# Patient Record
Sex: Female | Born: 1950 | Hispanic: No | Marital: Married | Smoking: Never smoker
Health system: Southern US, Community
[De-identification: ages and names within clinical notes are randomized; demographics above are authoritative.]

## PROBLEM LIST (undated history)

## (undated) DIAGNOSIS — Z9889 Other specified postprocedural states: Secondary | ICD-10-CM

## (undated) DIAGNOSIS — R112 Nausea with vomiting, unspecified: Secondary | ICD-10-CM

## (undated) DIAGNOSIS — K219 Gastro-esophageal reflux disease without esophagitis: Secondary | ICD-10-CM

## (undated) DIAGNOSIS — H269 Unspecified cataract: Secondary | ICD-10-CM

## (undated) DIAGNOSIS — M1990 Unspecified osteoarthritis, unspecified site: Secondary | ICD-10-CM

## (undated) DIAGNOSIS — R202 Paresthesia of skin: Secondary | ICD-10-CM

## (undated) DIAGNOSIS — F419 Anxiety disorder, unspecified: Secondary | ICD-10-CM

## (undated) DIAGNOSIS — E785 Hyperlipidemia, unspecified: Secondary | ICD-10-CM

## (undated) HISTORY — DX: Other specified postprocedural states: Z98.890

## (undated) HISTORY — DX: Unspecified cataract: H26.9

## (undated) HISTORY — PX: CATARACT EXTRACTION, BILATERAL: SHX1313

## (undated) HISTORY — DX: Nausea with vomiting, unspecified: R11.2

## (undated) HISTORY — PX: TONSILLECTOMY: SUR1361

## (undated) HISTORY — PX: ABDOMINAL HYSTERECTOMY: SHX81

## (undated) HISTORY — PX: OTHER SURGICAL HISTORY: SHX169

## (undated) HISTORY — DX: Hyperlipidemia, unspecified: E78.5

## (undated) HISTORY — PX: DILATION AND CURETTAGE OF UTERUS: SHX78

## (undated) HISTORY — PX: OVARIAN CYST REMOVAL: SHX89

## (undated) HISTORY — PX: APPENDECTOMY: SHX54

## (undated) HISTORY — PX: CHOLECYSTECTOMY: SHX55

---

## 1998-02-05 ENCOUNTER — Emergency Department (HOSPITAL_COMMUNITY): Admission: EM | Admit: 1998-02-05 | Discharge: 1998-02-05 | Payer: Self-pay | Admitting: Emergency Medicine

## 1998-08-02 ENCOUNTER — Other Ambulatory Visit: Admission: RE | Admit: 1998-08-02 | Discharge: 1998-08-02 | Payer: Self-pay | Admitting: Gynecology

## 1999-08-08 ENCOUNTER — Other Ambulatory Visit: Admission: RE | Admit: 1999-08-08 | Discharge: 1999-08-08 | Payer: Self-pay | Admitting: Gynecology

## 2000-07-30 ENCOUNTER — Other Ambulatory Visit: Admission: RE | Admit: 2000-07-30 | Discharge: 2000-07-30 | Payer: Self-pay | Admitting: Gynecology

## 2001-07-31 ENCOUNTER — Other Ambulatory Visit: Admission: RE | Admit: 2001-07-31 | Discharge: 2001-07-31 | Payer: Self-pay | Admitting: Gynecology

## 2002-05-20 ENCOUNTER — Other Ambulatory Visit: Admission: RE | Admit: 2002-05-20 | Discharge: 2002-05-20 | Payer: Self-pay | Admitting: Gynecology

## 2003-05-20 ENCOUNTER — Other Ambulatory Visit: Admission: RE | Admit: 2003-05-20 | Discharge: 2003-05-20 | Payer: Self-pay | Admitting: Gynecology

## 2004-06-20 ENCOUNTER — Other Ambulatory Visit: Admission: RE | Admit: 2004-06-20 | Discharge: 2004-06-20 | Payer: Self-pay | Admitting: Gynecology

## 2005-07-12 ENCOUNTER — Other Ambulatory Visit: Admission: RE | Admit: 2005-07-12 | Discharge: 2005-07-12 | Payer: Self-pay | Admitting: Gynecology

## 2006-08-15 ENCOUNTER — Other Ambulatory Visit: Admission: RE | Admit: 2006-08-15 | Discharge: 2006-08-15 | Payer: Self-pay | Admitting: Gynecology

## 2007-09-24 ENCOUNTER — Other Ambulatory Visit: Admission: RE | Admit: 2007-09-24 | Discharge: 2007-09-24 | Payer: Self-pay | Admitting: Gynecology

## 2008-08-19 ENCOUNTER — Other Ambulatory Visit: Admission: RE | Admit: 2008-08-19 | Discharge: 2008-08-19 | Payer: Self-pay | Admitting: Gynecology

## 2014-03-25 ENCOUNTER — Other Ambulatory Visit: Payer: Self-pay | Admitting: Internal Medicine

## 2014-03-25 DIAGNOSIS — S83289A Other tear of lateral meniscus, current injury, unspecified knee, initial encounter: Secondary | ICD-10-CM

## 2014-04-02 ENCOUNTER — Other Ambulatory Visit: Payer: Self-pay

## 2014-04-14 ENCOUNTER — Ambulatory Visit
Admission: RE | Admit: 2014-04-14 | Discharge: 2014-04-14 | Disposition: A | Discharge status: Home / Self Care | Payer: BC Managed Care – PPO | Source: Ambulatory Visit | Attending: Internal Medicine | Admitting: Internal Medicine

## 2014-04-14 DIAGNOSIS — S83289A Other tear of lateral meniscus, current injury, unspecified knee, initial encounter: Secondary | ICD-10-CM

## 2014-10-30 ENCOUNTER — Other Ambulatory Visit (HOSPITAL_COMMUNITY): Payer: Self-pay | Admitting: *Deleted

## 2014-11-02 ENCOUNTER — Ambulatory Visit (HOSPITAL_COMMUNITY)
Admission: RE | Admit: 2014-11-02 | Discharge: 2014-11-02 | Disposition: A | Discharge status: Home / Self Care | Payer: 59 | Source: Ambulatory Visit | Attending: Anesthesiology | Admitting: Anesthesiology

## 2014-11-02 ENCOUNTER — Encounter (HOSPITAL_COMMUNITY)
Admission: RE | Admit: 2014-11-02 | Discharge: 2014-11-02 | Disposition: A | Discharge status: Home / Self Care | Payer: 59 | Source: Ambulatory Visit | Attending: Orthopedic Surgery | Admitting: Orthopedic Surgery

## 2014-11-02 ENCOUNTER — Encounter (HOSPITAL_COMMUNITY): Payer: Self-pay

## 2014-11-02 DIAGNOSIS — R202 Paresthesia of skin: Secondary | ICD-10-CM

## 2014-11-02 DIAGNOSIS — R200 Anesthesia of skin: Secondary | ICD-10-CM | POA: Insufficient documentation

## 2014-11-02 HISTORY — DX: Paresthesia of skin: R20.2

## 2014-11-02 HISTORY — DX: Gastro-esophageal reflux disease without esophagitis: K21.9

## 2014-11-02 HISTORY — DX: Anxiety disorder, unspecified: F41.9

## 2014-11-02 LAB — URINALYSIS, ROUTINE W REFLEX MICROSCOPIC
Bilirubin Urine: NEGATIVE
Glucose, UA: NEGATIVE mg/dL
Hgb urine dipstick: NEGATIVE
Ketones, ur: NEGATIVE mg/dL
Leukocytes, UA: NEGATIVE
Nitrite: NEGATIVE
Protein, ur: NEGATIVE mg/dL
Specific Gravity, Urine: 1.012 (ref 1.005–1.030)
Urobilinogen, UA: 0.2 mg/dL (ref 0.0–1.0)
pH: 5 (ref 5.0–8.0)

## 2014-11-02 LAB — PROTIME-INR
INR: 0.98 (ref 0.00–1.49)
Prothrombin Time: 13.1 s (ref 11.6–15.2)

## 2014-11-02 LAB — APTT: APTT: 29 s (ref 24–37)

## 2014-11-02 LAB — SURGICAL PCR SCREEN
MRSA, PCR: NEGATIVE
Staphylococcus aureus: NEGATIVE

## 2014-11-02 NOTE — Patient Instructions (Signed)
22 Melissa Lee  11/02/2014   Your procedure is scheduled on:     Monday November 09, 2014  [facility] and follow signs to  Short Stay Center arrive at 12:40 PM.   Call this number if you have problems the morning of surgery [phone] or Presurgical Testing [phone].   Remember:  Do not eat food After Midnight but may take clear liquids till 8:40am day of surgery then nothing by mouth.  For Living Will and/or Health Care Power Attorney Forms: please provide copy for your medical record, may bring AM of surgery (forms should be already notarized-we do not provide this service).     Take these medicines the morning of surgery with A SIP OF WATER: NONE                               You may not have any metal on your body including hair pins and piercings  Do not wear jewelry, make-up, lotions, powders, prefumes or deodorant.  Do not shave body hair  48 hours(2 days) of CHG soap use.                Do not bring valuables to the hospital. [facility] IS NOT RESPONSIBLE FOR VALUABLES.  Contacts, dentures or bridgework may not be worn into surgery.    Patients discharged the day of surgery will not be allowed to drive home.  Name and phone number of your driver:Melissa Lee (husband)   Special Instructions: review fact sheets for MRSA information, Blood Transfusion fact sheet, Incentive Spirometry.  Remember: Type/Screen "Blue armsbands"- may not be removed once applied(would result in being retested AM of surgery, if removed). ________________________________________________________________________  [facility] Pre[facility] Before surgery, you can play an important role.  Because skin is not sterile, your skin needs to be as free of germs as possible.  You can reduce the number of germs on your skin by washing with CHG (chlorahexidine gluconate) soap before surgery.  CHG is an antiseptic cleaner which kills germs and bonds with the skin to  continue killing germs even after washing. Please DO NOT use if you have an allergy to CHG or antibacterial soaps.  If your skin becomes reddened/irritated stop using the CHG and inform your nurse when you arrive at Short Stay. Do not shave (including legs and underarms) for at least 48 hours prior to the first CHG shower.  You may shave your face/neck. Please follow these instructions carefully:  1.  Shower with CHG Soap the night before surgery and the  morning of Surgery.  2.  If you choose to wash your hair, wash your hair first as usual with your  normal  shampoo.  3.  After you shampoo, rinse your hair and body thoroughly to remove the  shampoo.                           4.  Use CHG as you would any other liquid soap.  You can apply chg directly  to the skin and wash                       Gently with a scrungie or clean washcloth.  5.  Apply the CHG Soap to your body ONLY FROM THE NECK DOWN.   Do not use on face/ open  Wound or open sores. Avoid contact with eyes, ears mouth and genitals (private parts).                       Wash face,  Genitals (private parts) with your normal soap.             6.  Wash thoroughly, paying special attention to the area where your surgery  will be performed.  7.  Thoroughly rinse your body with warm water from the neck down.  8.  DO NOT shower/wash with your normal soap after using and rinsing off  the CHG Soap.                9.  Pat yourself dry with a clean towel.            10.  Wear clean pajamas.            11.  Place clean sheets on your bed the night of your first shower and do not  sleep with pets. Day of Surgery : Do not apply any lotions/deodorants the morning of surgery.  Please wear clean clothes to the hospital/surgery center.  FAILURE TO FOLLOW THESE INSTRUCTIONS MAY RESULT IN THE CANCELLATION OF YOUR SURGERY PATIENT SIGNATURE_________________________________  NURSE  SIGNATURE__________________________________  ________________________________________________________________________    CLEAR LIQUID DIET   Foods Allowed                                                                     Foods Excluded  Coffee and tea, regular and decaf                             liquids that you cannot  Plain Jell-O in any flavor                                             see through such as: Fruit ices (not with fruit pulp)                                     milk, soups, orange juice  Iced Popsicles                                    All solid food Carbonated beverages, regular and diet                                    Cranberry, grape and apple juices Sports drinks like Gatorade Lightly seasoned clear broth or consume(fat free) Sugar, honey syrup  Sample Menu Breakfast                                Lunch  Supper Cranberry juice                    Beef broth                            Chicken broth Jell-O                                     Grape juice                           Apple juice Coffee or tea                        Jell-O                                      Popsicle                                                Coffee or tea                        Coffee or tea  _____________________________________________________________________    Incentive Spirometer  An incentive spirometer is a tool that can help keep your lungs clear and active. This tool measures how well you are filling your lungs with each breath. Taking long deep breaths may help reverse or decrease the chance of developing breathing (pulmonary) problems (especially infection) following:  A long period of time when you are unable to move or be active. BEFORE THE PROCEDURE   If the spirometer includes an indicator to show your best effort, your nurse or respiratory therapist will set it to a desired goal.  If possible, sit up straight or lean  slightly forward. Try not to slouch.  Hold the incentive spirometer in an upright position. INSTRUCTIONS FOR USE   Sit on the edge of your bed if possible, or sit up as far as you can in bed or on a chair.  Hold the incentive spirometer in an upright position.  Breathe out normally.  Place the mouthpiece in your mouth and seal your lips tightly around it.  Breathe in slowly and as deeply as possible, raising the piston or the ball toward the top of the column.  Hold your breath for 3-5 seconds or for as long as possible. Allow the piston or ball to fall to the bottom of the column.  Remove the mouthpiece from your mouth and breathe out normally.  Rest for a few seconds and repeat Steps 1 through 7 at least 10 times every 1-2 hours when you are awake. Take your time and take a few normal breaths between deep breaths.  The spirometer may include an indicator to show your best effort. Use the indicator as a goal to work toward during each repetition.  After each set of 10 deep breaths, practice coughing to be sure your lungs are clear. If you have an incision (the cut made at the time of surgery), support your incision when coughing by placing a pillow or rolled up towels firmly against  it. Once you are able to get out of bed, walk around indoors and cough well. You may stop using the incentive spirometer when instructed by your caregiver.  RISKS AND COMPLICATIONS  Take your time so you do not get dizzy or light-headed.  If you are in pain, you may need to take or ask for pain medication before doing incentive spirometry. It is harder to take a deep breath if you are having pain. AFTER USE  Rest and breathe slowly and easily.  It can be helpful to keep track of a log of your progress. Your caregiver can provide you with a simple table to help with this. If you are using the spirometer at home, follow these instructions: [place] IF:   You are having difficultly using the  spirometer.  You have trouble using the spirometer as often as instructed.  Your pain medication is not giving enough relief while using the spirometer.  You develop fever of 100.5 F (38.1 C) or higher. SEEK IMMEDIATE MEDICAL CARE IF:   You cough up bloody sputum that had not been present before.  You develop fever of 102 F (38.9 C) or greater.  You develop worsening pain at or near the incision site. MAKE SURE YOU:   Understand these instructions.  Will watch your condition.  Will get help right away if you are not doing well or get worse. Document Released: 02/19/2007 Document Revised: 01/01/2012 Document Reviewed: 04/22/2007 Exit[employer] Patient Information 2014 Exit[employer], [place].   ________________________________________________________________________  WHAT IS A BLOOD TRANSFUSION? Blood Transfusion Information  A transfusion is the replacement of blood or some of its parts. Blood is made up of multiple cells which provide different functions.  Red blood cells carry oxygen and are used for blood loss replacement.  White blood cells fight against infection.  Platelets control bleeding.  Plasma helps clot blood.  Other blood products are available for specialized needs, such as hemophilia or other clotting disorders. BEFORE THE TRANSFUSION  Who gives blood for transfusions?   Healthy volunteers who are fully evaluated to make sure their blood is safe. This is blood bank blood. Transfusion therapy is the safest it has ever been in the practice of medicine. Before blood is taken from a donor, a complete history is taken to make sure that person has no history of diseases nor engages in risky social behavior (examples are intravenous drug use or sexual activity with multiple partners). The donor's travel history is screened to minimize risk of transmitting infections, such as malaria. The donated blood is tested for signs of infectious diseases, such as HIV and hepatitis.  The blood is then tested to be sure it is compatible with you in order to minimize the chance of a transfusion reaction. If you or a relative donates blood, this is often done in anticipation of surgery and is not appropriate for emergency situations. It takes many days to process the donated blood. RISKS AND COMPLICATIONS Although transfusion therapy is very safe and saves many lives, the main dangers of transfusion include:   Getting an infectious disease.  Developing a transfusion reaction. This is an allergic reaction to something in the blood you were given. Every precaution is taken to prevent this. The decision to have a blood transfusion has been considered carefully by your caregiver before blood is given. Blood is not given unless the benefits outweigh the risks. AFTER THE TRANSFUSION  Right after receiving a blood transfusion, you will usually feel much better and more  energetic. This is especially true if your red blood cells have gotten low (anemic). The transfusion raises the level of the red blood cells which carry oxygen, and this usually causes an energy increase.  The nurse administering the transfusion will monitor you carefully for complications. HOME CARE INSTRUCTIONS  No special instructions are needed after a transfusion. You may find your energy is better. Speak with your caregiver about any limitations on activity for underlying diseases you may have. SEEK MEDICAL CARE IF:   Your condition is not improving after your transfusion.  You develop redness or irritation at the intravenous (IV) site. SEEK IMMEDIATE MEDICAL CARE IF:  Any of the following symptoms occur over the next 12 hours:  Shaking chills.  You have a temperature by mouth above 102 F (38.9 C), not controlled by medicine.  Chest, back, or muscle pain.  People around you feel you are not acting correctly or are confused.  Shortness of breath or difficulty breathing.  Dizziness and fainting.  You  get a rash or develop hives.  You have a decrease in urine output.  Your urine turns a dark color or changes to pink, red, or brown. Any of the following symptoms occur over the next 10 days:  You have a temperature by mouth above 102 F (38.9 C), not controlled by medicine.  Shortness of breath.  Weakness after normal activity.  The white part of the eye turns yellow (jaundice).  You have a decrease in the amount of urine or are urinating less often.  Your urine turns a dark color or changes to pink, red, or brown. Document Released: 10/06/2000 Document Revised: 01/01/2012 Document Reviewed: 05/25/2008 [facility] Patient Information 2014 [place], [place].  _______________________________________________________________________

## 2014-11-02 NOTE — Progress Notes (Addendum)
EKG per chart 10/28/2014  Labs per chart [facility] and CMP 10/28/2014  Pt due to have ECHO preformed 11/03/2014 secondary to tingling per left arm

## 2014-11-06 NOTE — H&P (Signed)
UNICOMPARTMENTAL KNEE ADMISSION H&P  Patient is being admitted for right medial unicompartmental knee arthroplasty.  Subjective:  Chief Complaint:      Right knee medial compartment primary OA / pain.  HPI: Melissa Lee, 64 y.o. female, has a history of pain and functional disability in the right knee due to arthritis and has failed non-surgical conservative treatments for greater than 12 weeks to include NSAID's and/or analgesics, corticosteriod injections, viscosupplementation injections, use of assistive devices and activity modification.  Onset of symptoms was gradual, starting 1+ years ago with gradually worsening course since that time.  Patient currently rates pain in the right knee(s) at 8 out of 10 with activity. Patient has worsening of pain with activity and weight bearing, pain that interferes with activities of daily living, pain with passive range of motion, crepitus and joint swelling.  Patient has evidence of periarticular osteophytes and joint space narrowing of the medial compartment by imaging studies. There is no active infection.  Risks, benefits and expectations were discussed with the patient.  Risks including but not limited to the risk of anesthesia, blood clots, nerve damage, blood vessel damage, failure of the prosthesis, infection and up to and including death.  Patient understand the risks, benefits and expectations and wishes to proceed with surgery.   PCP: Galvin Proffer, MD  D/C Plans:      Home with HHPT  Post-op Meds:       No Rx given  Tranexamic Acid:      To be given - IV   Decadron:      Is to be given  FYI:     ASA post-op  Norco post-op    Past Medical History  Diagnosis Date  . Anxiety   . GERD (gastroesophageal reflux disease)     occas   . Tingling in extremities     left arm comes and goes pt due to have ECHO 11/03/2014    Past Surgical History  Procedure Laterality Date  . Tonsillectomy    . Appendectomy    . Cesarean section    .  Ovarian cyst removal      hx of   . Colonscopy    . Abdominal hysterectomy    . Cholecystectomy      No prescriptions prior to admission   No Known Allergies   History  Substance Use Topics  . Smoking status: Never Smoker   . Smokeless tobacco: Never Used  . Alcohol Use: No    No family history on file.   Review of Systems  Constitutional: Negative.   HENT: Negative.   Eyes: Negative.   Respiratory: Negative.   Cardiovascular: Negative.   Gastrointestinal: Positive for heartburn.  Genitourinary: Negative.   Musculoskeletal: Positive for joint pain.  Skin: Negative.   Neurological: Negative.   Endo/Heme/Allergies: Negative.   Psychiatric/Behavioral: The patient is nervous/anxious.     Objective:  Physical Exam  Constitutional: She is oriented to person, place, and time. She appears well-developed and well-nourished.  HENT:  Head: Normocephalic and atraumatic.  Eyes: Pupils are equal, round, and reactive to light.  Neck: Neck supple. No JVD present. No tracheal deviation present. No thyromegaly present.  Cardiovascular: Normal rate and intact distal pulses.   Respiratory: Effort normal and breath sounds normal. No stridor.  GI: Soft. There is no tenderness. There is no guarding.  Musculoskeletal:       Right knee: She exhibits decreased range of motion, swelling, abnormal alignment and bony tenderness. She exhibits no  ecchymosis, no deformity, no laceration and no erythema. Tenderness found. Medial joint line tenderness noted. No lateral joint line tenderness noted.  Lymphadenopathy:    She has no cervical adenopathy.  Neurological: She is alert and oriented to person, place, and time.  Skin: Skin is warm and dry.  Psychiatric: She has a normal mood and affect.      Imaging Review Plain radiographs demonstrate severe degenerative joint disease of the right knee medial compartment. The overall alignment is mild varus. The bone quality appears to be good for age  and reported activity level.  Assessment/Plan:  End stage arthritis, right knee, medial compartment  The patient history, physical examination, clinical judgment of the provider and imaging studies are consistent with end stage degenerative joint disease of the right knee(s) and medial unicompartmental knee arthroplasty is deemed medically necessary. The treatment options including medical management, injection therapy arthroscopy and arthroplasty were discussed at length. The risks and benefits of total knee arthroplasty were presented and reviewed. The risks due to aseptic loosening, infection, stiffness, patella tracking problems, thromboembolic complications and other imponderables were discussed. The patient acknowledged the explanation, agreed to proceed with the plan and consent was signed. Patient is being admitted for observation treatment for surgery, pain control, PT, OT, prophylactic antibiotics, VTE prophylaxis, progressive ambulation and ADL's and discharge planning. The patient is planning to be discharged home with home health services.       Anastasio AuerbachMatthew S. Tysheka Fanguy   PA-C  11/06/2014, 9:21 AM

## 2014-11-09 ENCOUNTER — Encounter (HOSPITAL_COMMUNITY): Payer: Self-pay | Admitting: *Deleted

## 2014-11-09 ENCOUNTER — Ambulatory Visit (HOSPITAL_COMMUNITY): Payer: 59 | Admitting: Certified Registered Nurse Anesthetist

## 2014-11-09 ENCOUNTER — Observation Stay (HOSPITAL_COMMUNITY)
Admission: RE | Admit: 2014-11-09 | Discharge: 2014-11-10 | Disposition: A | Discharge status: Home / Self Care | Payer: 59 | Source: Ambulatory Visit | Attending: Orthopedic Surgery | Admitting: Orthopedic Surgery

## 2014-11-09 ENCOUNTER — Encounter (HOSPITAL_COMMUNITY)
Admission: RE | Disposition: A | Discharge status: Home / Self Care | Payer: Self-pay | Source: Ambulatory Visit | Attending: Orthopedic Surgery

## 2014-11-09 DIAGNOSIS — Z96659 Presence of unspecified artificial knee joint: Secondary | ICD-10-CM

## 2014-11-09 DIAGNOSIS — K219 Gastro-esophageal reflux disease without esophagitis: Secondary | ICD-10-CM | POA: Insufficient documentation

## 2014-11-09 DIAGNOSIS — M1711 Unilateral primary osteoarthritis, right knee: Principal | ICD-10-CM | POA: Insufficient documentation

## 2014-11-09 DIAGNOSIS — Z96651 Presence of right artificial knee joint: Secondary | ICD-10-CM

## 2014-11-09 DIAGNOSIS — F419 Anxiety disorder, unspecified: Secondary | ICD-10-CM | POA: Insufficient documentation

## 2014-11-09 HISTORY — DX: Unspecified osteoarthritis, unspecified site: M19.90

## 2014-11-09 HISTORY — PX: PARTIAL KNEE ARTHROPLASTY: SHX2174

## 2014-11-09 LAB — TYPE AND SCREEN
ABO/RH(D): A NEG
Antibody Screen: NEGATIVE

## 2014-11-09 LAB — ABO/RH: ABO/RH(D): A NEG

## 2014-11-09 SURGERY — ARTHROPLASTY, KNEE, UNICOMPARTMENTAL
Anesthesia: Spinal | Laterality: Right

## 2014-11-09 MED ORDER — PHENYLEPHRINE HCL 10 MG/ML IJ SOLN
INTRAMUSCULAR | Status: DC | PRN
  Administered 2014-11-09 (×2): 80 ug via INTRAVENOUS
  Administered 2014-11-09 (×3): 40 ug via INTRAVENOUS

## 2014-11-09 MED ORDER — MIDAZOLAM HCL 2 MG/2ML IJ SOLN
INTRAMUSCULAR | Status: AC
  Filled 2014-11-09: qty 2

## 2014-11-09 MED ORDER — HYDROMORPHONE HCL 1 MG/ML IJ SOLN: 0.5000 mg | INTRAMUSCULAR | Status: DC | PRN

## 2014-11-09 MED ORDER — HYDROCODONE-ACETAMINOPHEN 7.5-325 MG PO TABS
1.0000 | ORAL_TABLET | ORAL | Status: DC
  Administered 2014-11-09: 2 via ORAL
  Administered 2014-11-10 (×3): 1 via ORAL
  Filled 2014-11-09: qty 2
  Filled 2014-11-09 (×2): qty 1
  Filled 2014-11-09: qty 2

## 2014-11-09 MED ORDER — SODIUM CHLORIDE 0.9 % IJ SOLN
INTRAMUSCULAR | Status: AC
  Filled 2014-11-09: qty 50

## 2014-11-09 MED ORDER — PHENOL 1.4 % MT LIQD
1.0000 | OROMUCOSAL | Status: DC | PRN
Start: 2014-11-09 — End: 2014-11-10

## 2014-11-09 MED ORDER — LORAZEPAM 1 MG PO TABS
1.0000 mg | ORAL_TABLET | Freq: Every day | ORAL | Status: DC
  Administered 2014-11-09: 1 mg via ORAL
  Filled 2014-11-09: qty 1

## 2014-11-09 MED ORDER — ALUM & MAG HYDROXIDE-SIMETH 200-200-20 MG/5ML PO SUSP: 30.0000 mL | ORAL | Status: DC | PRN

## 2014-11-09 MED ORDER — DIPHENHYDRAMINE HCL 25 MG PO CAPS: 25.0000 mg | ORAL_CAPSULE | Freq: Four times a day (QID) | ORAL | Status: DC | PRN

## 2014-11-09 MED ORDER — DEXTROSE 5 % IV SOLN
500.0000 mg | Freq: Four times a day (QID) | INTRAVENOUS | Status: DC | PRN
  Filled 2014-11-09: qty 5

## 2014-11-09 MED ORDER — FERROUS SULFATE 325 (65 FE) MG PO TABS
325.0000 mg | ORAL_TABLET | Freq: Three times a day (TID) | ORAL | Status: DC
  Administered 2014-11-10: 325 mg via ORAL
  Filled 2014-11-09 (×4): qty 1

## 2014-11-09 MED ORDER — LACTATED RINGERS IV SOLN
INTRAVENOUS | Status: DC
  Administered 2014-11-09: 10:00:00 via INTRAVENOUS
  Administered 2014-11-09: 1000 mL via INTRAVENOUS

## 2014-11-09 MED ORDER — OXYMETAZOLINE HCL 0.05 % NA SOLN
1.0000 | Freq: Every evening | NASAL | Status: DC | PRN
  Filled 2014-11-09: qty 15

## 2014-11-09 MED ORDER — METOCLOPRAMIDE HCL 10 MG PO TABS: 5.0000 mg | ORAL_TABLET | Freq: Three times a day (TID) | ORAL | Status: DC | PRN

## 2014-11-09 MED ORDER — ASPIRIN EC 325 MG PO TBEC
325.0000 mg | DELAYED_RELEASE_TABLET | Freq: Every day | ORAL | Status: DC
  Administered 2014-11-10: 325 mg via ORAL
  Filled 2014-11-09 (×2): qty 1

## 2014-11-09 MED ORDER — CELECOXIB 200 MG PO CAPS
200.0000 mg | ORAL_CAPSULE | Freq: Two times a day (BID) | ORAL | Status: DC
  Administered 2014-11-09 – 2014-11-10 (×2): 200 mg via ORAL
  Filled 2014-11-09 (×3): qty 1

## 2014-11-09 MED ORDER — LIDOCAINE HCL (CARDIAC) 20 MG/ML IV SOLN
INTRAVENOUS | Status: AC
  Filled 2014-11-09: qty 5

## 2014-11-09 MED ORDER — BUPIVACAINE-EPINEPHRINE (PF) 0.25% -1:200000 IJ SOLN
INTRAMUSCULAR | Status: AC
  Filled 2014-11-09: qty 30

## 2014-11-09 MED ORDER — KETOROLAC TROMETHAMINE 30 MG/ML IJ SOLN
INTRAMUSCULAR | Status: DC | PRN
  Administered 2014-11-09: 30 mg via INTRAVENOUS

## 2014-11-09 MED ORDER — FENTANYL CITRATE 0.05 MG/ML IJ SOLN
INTRAMUSCULAR | Status: DC | PRN
  Administered 2014-11-09 (×2): 50 ug via INTRAVENOUS

## 2014-11-09 MED ORDER — METHOCARBAMOL 500 MG PO TABS
500.0000 mg | ORAL_TABLET | Freq: Four times a day (QID) | ORAL | Status: DC | PRN
  Administered 2014-11-10: 500 mg via ORAL
  Filled 2014-11-09: qty 1

## 2014-11-09 MED ORDER — PROPOFOL 10 MG/ML IV BOLUS
INTRAVENOUS | Status: AC
  Filled 2014-11-09: qty 20

## 2014-11-09 MED ORDER — POLYETHYLENE GLYCOL 3350 17 G PO PACK
17.0000 g | PACK | Freq: Two times a day (BID) | ORAL | Status: DC
  Administered 2014-11-10: 17 g via ORAL

## 2014-11-09 MED ORDER — MENTHOL 3 MG MT LOZG
1.0000 | LOZENGE | OROMUCOSAL | Status: DC | PRN
  Filled 2014-11-09: qty 9

## 2014-11-09 MED ORDER — SODIUM CHLORIDE 0.9 % IJ SOLN
INTRAMUSCULAR | Status: DC | PRN
  Administered 2014-11-09: 30 mL via INTRAVENOUS

## 2014-11-09 MED ORDER — HYDROMORPHONE HCL 1 MG/ML IJ SOLN
INTRAMUSCULAR | Status: AC
  Filled 2014-11-09: qty 1

## 2014-11-09 MED ORDER — CEFAZOLIN SODIUM-DEXTROSE 2-3 GM-% IV SOLR
2.0000 g | INTRAVENOUS | Status: AC
  Administered 2014-11-09: 2 g via INTRAVENOUS

## 2014-11-09 MED ORDER — BUPIVACAINE IN DEXTROSE 0.75-8.25 % IT SOLN
INTRATHECAL | Status: DC | PRN
  Administered 2014-11-09: 2 mL via INTRATHECAL

## 2014-11-09 MED ORDER — MIDAZOLAM HCL 5 MG/5ML IJ SOLN
INTRAMUSCULAR | Status: DC | PRN
  Administered 2014-11-09: 2 mg via INTRAVENOUS

## 2014-11-09 MED ORDER — 0.9 % SODIUM CHLORIDE (POUR BTL) OPTIME
TOPICAL | Status: DC | PRN
  Administered 2014-11-09: 1000 mL

## 2014-11-09 MED ORDER — KETOROLAC TROMETHAMINE 30 MG/ML IJ SOLN
INTRAMUSCULAR | Status: AC
  Filled 2014-11-09: qty 1

## 2014-11-09 MED ORDER — CHLORHEXIDINE GLUCONATE 4 % EX LIQD: 60.0000 mL | Freq: Once | CUTANEOUS | Status: DC

## 2014-11-09 MED ORDER — DEXAMETHASONE SODIUM PHOSPHATE 10 MG/ML IJ SOLN
INTRAMUSCULAR | Status: AC
  Filled 2014-11-09: qty 1

## 2014-11-09 MED ORDER — OXYCODONE HCL 5 MG/5ML PO SOLN
5.0000 mg | Freq: Once | ORAL | Status: DC | PRN
  Filled 2014-11-09: qty 5

## 2014-11-09 MED ORDER — ONDANSETRON HCL 4 MG/2ML IJ SOLN: 4.0000 mg | Freq: Four times a day (QID) | INTRAMUSCULAR | Status: DC | PRN

## 2014-11-09 MED ORDER — METOCLOPRAMIDE HCL 5 MG/ML IJ SOLN: 5.0000 mg | Freq: Three times a day (TID) | INTRAMUSCULAR | Status: DC | PRN

## 2014-11-09 MED ORDER — CEFAZOLIN SODIUM-DEXTROSE 2-3 GM-% IV SOLR
2.0000 g | Freq: Four times a day (QID) | INTRAVENOUS | Status: AC
  Administered 2014-11-09 (×2): 2 g via INTRAVENOUS
  Filled 2014-11-09 (×2): qty 50

## 2014-11-09 MED ORDER — ONDANSETRON HCL 4 MG/2ML IJ SOLN
INTRAMUSCULAR | Status: DC | PRN
  Administered 2014-11-09: 4 mg via INTRAVENOUS

## 2014-11-09 MED ORDER — DEXAMETHASONE SODIUM PHOSPHATE 10 MG/ML IJ SOLN
10.0000 mg | Freq: Once | INTRAMUSCULAR | Status: AC
  Administered 2014-11-10: 10 mg via INTRAVENOUS
  Filled 2014-11-09: qty 1

## 2014-11-09 MED ORDER — PROMETHAZINE HCL 25 MG/ML IJ SOLN: 6.2500 mg | INTRAMUSCULAR | Status: DC | PRN

## 2014-11-09 MED ORDER — ONDANSETRON HCL 4 MG/2ML IJ SOLN
INTRAMUSCULAR | Status: AC
  Filled 2014-11-09: qty 2

## 2014-11-09 MED ORDER — SIMVASTATIN 40 MG PO TABS
40.0000 mg | ORAL_TABLET | Freq: Every day | ORAL | Status: DC
  Filled 2014-11-09 (×2): qty 1

## 2014-11-09 MED ORDER — MAGNESIUM CITRATE PO SOLN: 1.0000 | Freq: Once | ORAL | Status: AC | PRN

## 2014-11-09 MED ORDER — ONDANSETRON HCL 4 MG PO TABS: 4.0000 mg | ORAL_TABLET | Freq: Four times a day (QID) | ORAL | Status: DC | PRN

## 2014-11-09 MED ORDER — PROPOFOL INFUSION 10 MG/ML OPTIME
INTRAVENOUS | Status: DC | PRN
  Administered 2014-11-09: 100 ug/kg/min via INTRAVENOUS

## 2014-11-09 MED ORDER — LIDOCAINE HCL (CARDIAC) 20 MG/ML IV SOLN
INTRAVENOUS | Status: DC | PRN
  Administered 2014-11-09: 100 mg via INTRAVENOUS

## 2014-11-09 MED ORDER — BISACODYL 10 MG RE SUPP: 10.0000 mg | Freq: Every day | RECTAL | Status: DC | PRN

## 2014-11-09 MED ORDER — SODIUM CHLORIDE 0.9 % IV SOLN
INTRAVENOUS | Status: DC
  Administered 2014-11-09 – 2014-11-10 (×2): via INTRAVENOUS
  Filled 2014-11-09 (×10): qty 1000

## 2014-11-09 MED ORDER — CEFAZOLIN SODIUM-DEXTROSE 2-3 GM-% IV SOLR
INTRAVENOUS | Status: AC
  Filled 2014-11-09: qty 50

## 2014-11-09 MED ORDER — MEPERIDINE HCL 50 MG/ML IJ SOLN
INTRAMUSCULAR | Status: AC
  Filled 2014-11-09: qty 1

## 2014-11-09 MED ORDER — OXYCODONE HCL 5 MG PO TABS: 5.0000 mg | ORAL_TABLET | Freq: Once | ORAL | Status: DC | PRN

## 2014-11-09 MED ORDER — BUPIVACAINE-EPINEPHRINE 0.25% -1:200000 IJ SOLN
INTRAMUSCULAR | Status: DC | PRN
  Administered 2014-11-09: 30 mL

## 2014-11-09 MED ORDER — MEPERIDINE HCL 50 MG/ML IJ SOLN
6.2500 mg | INTRAMUSCULAR | Status: DC | PRN
  Administered 2014-11-09 (×2): 12.5 mg via INTRAVENOUS

## 2014-11-09 MED ORDER — PHENYLEPHRINE 40 MCG/ML (10ML) SYRINGE FOR IV PUSH (FOR BLOOD PRESSURE SUPPORT)
PREFILLED_SYRINGE | INTRAVENOUS | Status: AC
  Filled 2014-11-09: qty 10

## 2014-11-09 MED ORDER — SODIUM CHLORIDE 0.9 % IV SOLN
1000.0000 mg | Freq: Once | INTRAVENOUS | Status: AC
  Administered 2014-11-09: 1000 mg via INTRAVENOUS
  Filled 2014-11-09: qty 10

## 2014-11-09 MED ORDER — DEXAMETHASONE SODIUM PHOSPHATE 10 MG/ML IJ SOLN
10.0000 mg | Freq: Once | INTRAMUSCULAR | Status: AC
  Administered 2014-11-09: 10 mg via INTRAVENOUS

## 2014-11-09 MED ORDER — FENTANYL CITRATE 0.05 MG/ML IJ SOLN
INTRAMUSCULAR | Status: AC
  Filled 2014-11-09: qty 2

## 2014-11-09 MED ORDER — DOCUSATE SODIUM 100 MG PO CAPS
100.0000 mg | ORAL_CAPSULE | Freq: Two times a day (BID) | ORAL | Status: DC
  Administered 2014-11-09 – 2014-11-10 (×2): 100 mg via ORAL

## 2014-11-09 MED ORDER — HYDROMORPHONE HCL 1 MG/ML IJ SOLN
0.2500 mg | INTRAMUSCULAR | Status: DC | PRN
  Administered 2014-11-09 (×2): 0.5 mg via INTRAVENOUS

## 2014-11-09 SURGICAL SUPPLY — 52 items
BAG SPEC THK2 15X12 ZIP CLS (MISCELLANEOUS)
BAG ZIPLOCK 12X15 (MISCELLANEOUS) IMPLANT
BANDAGE ELASTIC 6 VELCRO ST LF (GAUZE/BANDAGES/DRESSINGS) ×3 IMPLANT
BANDAGE ESMARK 6X9 LF (GAUZE/BANDAGES/DRESSINGS) ×1 IMPLANT
BLADE SAW RECIPROCATING 77.5 (BLADE) ×3 IMPLANT
BLADE SAW SGTL 13.0X1.19X90.0M (BLADE) ×3 IMPLANT
BNDG ESMARK 6X9 LF (GAUZE/BANDAGES/DRESSINGS) ×3
BOWL SMART MIX CTS (DISPOSABLE) ×3 IMPLANT
CAPT KNEE PARTIAL 2 ×3 IMPLANT
CEMENT HV SMART SET (Cement) ×3 IMPLANT
CUFF TOURN SGL QUICK 34 (TOURNIQUET CUFF) ×2
CUFF TRNQT CYL 34X4X40X1 (TOURNIQUET CUFF) ×1 IMPLANT
DERMABOND ADVANCED (GAUZE/BANDAGES/DRESSINGS) ×2
DERMABOND ADVANCED .7 DNX12 (GAUZE/BANDAGES/DRESSINGS) ×1 IMPLANT
DRAPE EXTREMITY T 121X128X90 (DRAPE) ×3 IMPLANT
DRAPE POUCH INSTRU U-SHP 10X18 (DRAPES) ×3 IMPLANT
DRAPE U-SHAPE 47X51 STRL (DRAPES) ×3 IMPLANT
DRSG AQUACEL AG ADV 3.5X10 (GAUZE/BANDAGES/DRESSINGS) ×3 IMPLANT
DRSG TEGADERM 4X4.75 (GAUZE/BANDAGES/DRESSINGS) IMPLANT
DURAPREP 26ML APPLICATOR (WOUND CARE) ×6 IMPLANT
ELECT REM PT RETURN 9FT ADLT (ELECTROSURGICAL) ×3
ELECTRODE REM PT RTRN 9FT ADLT (ELECTROSURGICAL) ×1 IMPLANT
EVACUATOR 1/8 PVC DRAIN (DRAIN) IMPLANT
FACESHIELD WRAPAROUND (MASK) ×12 IMPLANT
GAUZE SPONGE 2X2 8PLY STRL LF (GAUZE/BANDAGES/DRESSINGS) IMPLANT
GLOVE BIOGEL PI IND STRL 7.5 (GLOVE) ×1 IMPLANT
GLOVE BIOGEL PI IND STRL 8.5 (GLOVE) ×1 IMPLANT
GLOVE BIOGEL PI INDICATOR 7.5 (GLOVE) ×2
GLOVE BIOGEL PI INDICATOR 8.5 (GLOVE) ×2
GLOVE ECLIPSE 8.0 STRL XLNG CF (GLOVE) ×3 IMPLANT
GLOVE ORTHO TXT STRL SZ7.5 (GLOVE) ×6 IMPLANT
GOWN SPEC L3 XXLG W/TWL (GOWN DISPOSABLE) ×3 IMPLANT
GOWN STRL REUS W/TWL LRG LVL3 (GOWN DISPOSABLE) ×3 IMPLANT
KIT BASIN OR (CUSTOM PROCEDURE TRAY) ×3 IMPLANT
LEGGING LITHOTOMY PAIR STRL (DRAPES) ×3 IMPLANT
LIQUID BAND (GAUZE/BANDAGES/DRESSINGS) ×3 IMPLANT
MANIFOLD NEPTUNE II (INSTRUMENTS) ×3 IMPLANT
NDL SAFETY ECLIPSE 18X1.5 (NEEDLE) ×1 IMPLANT
NEEDLE HYPO 18GX1.5 SHARP (NEEDLE) ×2
PACK TOTAL JOINT (CUSTOM PROCEDURE TRAY) ×3 IMPLANT
SPONGE GAUZE 2X2 STER 10/PKG (GAUZE/BANDAGES/DRESSINGS)
SUCTION FRAZIER TIP 10 FR DISP (SUCTIONS) ×3 IMPLANT
SUT MNCRL AB 4-0 PS2 18 (SUTURE) ×3 IMPLANT
SUT VIC AB 1 CT1 36 (SUTURE) ×3 IMPLANT
SUT VIC AB 2-0 CT1 27 (SUTURE) ×4
SUT VIC AB 2-0 CT1 TAPERPNT 27 (SUTURE) ×2 IMPLANT
SUT VLOC 180 0 24IN GS25 (SUTURE) ×3 IMPLANT
SYR 50ML LL SCALE MARK (SYRINGE) ×3 IMPLANT
TOWEL OR 17X26 10 PK STRL BLUE (TOWEL DISPOSABLE) ×3 IMPLANT
TOWEL OR NON WOVEN STRL DISP B (DISPOSABLE) IMPLANT
TRAY FOLEY CATH 14FRSI W/METER (CATHETERS) IMPLANT
WRAP KNEE MAXI GEL POST OP (GAUZE/BANDAGES/DRESSINGS) ×3 IMPLANT

## 2014-11-09 NOTE — Interval H&P Note (Signed)
History and Physical Interval Note:  11/09/2014 9:00 AM  Melissa Lee  has presented today for surgery, with the diagnosis of right knee medial compartment OA  The various methods of treatment have been discussed with the patient and family. After consideration of risks, benefits and other options for treatment, the patient has consented to  Procedure(s): UNICOMPARTMENTAL KNEE, MEDIAL SIDE (Right) as a surgical intervention .  The patient's history has been reviewed, patient examined, no change in status, stable for surgery.  I have reviewed the patient's chart and labs.  Questions were answered to the patient's satisfaction.     Shelda PalLIN,Avelynn Sellin D

## 2014-11-09 NOTE — Evaluation (Signed)
Physical Therapy Evaluation Patient Details Name: Melissa Lee MRN: [id number] DOB: 14-Mar-1951 Today's Date: 11/09/2014   History of Present Illness  s/p R UKR  Clinical Impression  Pt will benefit from PT to address deficits below; Will see again in am for HEP, stair training; pt did very well this pm, amb 120' with min/guard    Follow Up Recommendations Home health PT    Equipment Recommendations  None recommended by PT (pt has SW, does not want RW at this time)    Recommendations for Other Services       Precautions / Restrictions Precautions Precautions: Fall Restrictions Other Position/Activity Restrictions: WBAT      Mobility  Bed Mobility Overal bed mobility: Needs Assistance Bed Mobility: Supine to Sit     Supine to sit: Supervision     General bed mobility comments: for safety  Transfers Overall transfer level: Needs assistance Equipment used: Rolling walker (2 wheeled) Transfers: Sit to/from Stand Sit to Stand: Supervision         General transfer comment: cues for safety and hand placement  Ambulation/Gait Ambulation/Gait assistance: Min guard Ambulation Distance (Feet): 120 Feet Assistive device: Rolling walker (2 wheeled) Gait Pattern/deviations: Step-to pattern;Step-through pattern;Decreased stride length     General Gait Details: cues for RW use and safety  Stairs            Wheelchair Mobility    Modified Rankin (Stroke Patients Only)       Balance                                             Pertinent Vitals/Pain Pain Assessment: No/denies pain    Home Living Family/patient expects to be discharged to:: Private residence Living Arrangements: Spouse/significant other   Type of Home: House Home Access: Stairs to enter Entrance Stairs-Rails: Right Entrance Stairs-Number of Steps: 5 Home Layout: One level Home Equipment: Cane - single point;Walker - standard      Prior Function Level of  Independence: Independent               Hand Dominance        Extremity/Trunk Assessment   Upper Extremity Assessment: Overall WFL for tasks assessed           Lower Extremity Assessment: RLE deficits/detail RLE Deficits / Details: ankle WFL; knee and hip grossly 3/5;  AAROM  -5 to 55*       Communication   Communication: No difficulties  Cognition Arousal/Alertness: Awake/alert Behavior During Therapy: WFL for tasks assessed/performed Overall Cognitive Status: Within Functional Limits for tasks assessed                      General Comments      Exercises Total Joint Exercises Ankle Circles/Pumps: AROM;Both;10 reps Quad Sets: 10 reps;Both;AROM Heel Slides: AROM;AAROM;Right;10 reps      Assessment/Plan    PT Assessment Patient needs continued PT services  PT Diagnosis Difficulty walking   PT Problem List Decreased strength;Decreased range of motion;Decreased activity tolerance;Decreased mobility;Decreased knowledge of use of DME  PT Treatment Interventions DME instruction;Gait training;Stair training;Functional mobility training;Therapeutic activities;Therapeutic exercise;Patient/family education   PT Goals (Current goals can be found in the Care Plan section) Acute Rehab PT Goals Patient Stated Goal: home, knee better and less pain PT Goal Formulation: With patient Time For Goal Achievement: 11/12/14 Potential to Achieve Goals: Good  Frequency 7X/week   Barriers to discharge        Co-evaluation               End of Session Equipment Utilized During Treatment: Gait belt Activity Tolerance: Patient tolerated treatment well Patient left: in chair;with call bell/phone within reach Nurse Communication: Mobility status    Functional Assessment Tool Used: clinical judgement Functional Limitation: Mobility: Walking and moving around Mobility: Walking and Moving Around Current Status (W0981(G8978): At least 1 percent but less than 20  percent impaired, limited or restricted Mobility: Walking and Moving Around Goal Status [phone](G8979): At least 1 percent but less than 20 percent impaired, limited or restricted    Time: 1726-1740 PT Time Calculation (min) (ACUTE ONLY): 14 min   Charges:   PT Evaluation $Initial PT Evaluation Tier I: 1 Procedure PT Treatments $Gait Training: 8-22 mins   PT G Codes:   PT G-Codes **NOT FOR INPATIENT CLASS** Functional Assessment Tool Used: clinical judgement Functional Limitation: Mobility: Walking and moving around Mobility: Walking and Moving Around Current Status (W2956(G8978): At least 1 percent but less than 20 percent impaired, limited or restricted Mobility: Walking and Moving Around Goal Status [phone]([id number]): At least 1 percent but less than 20 percent impaired, limited or restricted    [facility] HospitalWILLIAMS,Iyannah Blake 11/09/2014, 5:45 PM

## 2014-11-09 NOTE — Anesthesia Procedure Notes (Signed)
Spinal Patient location during procedure: OR Start time: 11/09/2014 9:23 AM End time: 11/09/2014 9:27 AM Staffing Resident/CRNA: Maxwell Caul Performed by: resident/CRNA  Preanesthetic Checklist Completed: patient identified, site marked, surgical consent, pre-op evaluation, timeout performed, IV checked, risks and benefits discussed and monitors and equipment checked Spinal Block Patient position: sitting Prep: Betadine Patient monitoring: heart rate, continuous pulse ox and blood pressure Approach: midline Location: L3-4 Injection technique: single-shot Needle Needle type: Sprotte  Needle gauge: 24 G Needle length: 9 cm Additional Notes Expiration date of kit checked and confirmed. Patient tolerated procedure well; no complications noted.

## 2014-11-09 NOTE — Op Note (Signed)
NAME: Melissa Lee    MEDICAL RECORD NO.: [id number]   [facility]AC[facility]   DATE OF BIRTH: [birth year]  PHYSICIAN: Madlyn FrankelMatthew D. Charlann Boxerlin, M.D.    DATE OF PROCEDURE: 11/09/2014    OPERATIVE REPORT   PREOPERATIVE DIAGNOSIS: Right knee medial compartment osteoarthritis.   POSTOPERATIVE DIAGNOSIS: Right knee medial compartment osteoarthritis.  PROCEDURE: Right partial knee replacement utilizing Biomet Oxford knee  component, size X-SMALL femur, a right medial size AA tibial tray with a size 5 mm insert.   SURGEON: Madlyn FrankelMatthew D. Charlann Boxerlin, M.D.   ASSISTANT: Lanney GinsMatthew Babish, PAC.  Please note that Mr. Carmon SailsBabish was present for the entirety of the case,  utilized for preoperative positioning, perioperative retractor  management, general facilitation of the case and primary wound closure.   ANESTHESIA: Spinal.   SPECIMENS: None.   COMPLICATIONS: None.  DRAINS: None   TOURNIQUET TIME: 29 minutes at 250 mmHg.   INDICATIONS FOR PROCEDURE: The patient is a 64 y.o. patient of mine who presented for evaluation of right knee pain.  They presented with primary complaints of pain on the medial side of their knee. Radiographs revealed advanced medial compartment arthritis with specifically an antero-medial wear pattern.  There was bone on bone changes noted with subchondral sclerosis and osteophytes present. The patient has had progressive problems failing to respond to conservative measures of medications, injections and activity modification. Risks of infection, DVT, component failure, need for future revision surgery were all discussed and reviewed.  Consent was obtained for benefit of pain relief.   PROCEDURE IN DETAIL: The patient was brought to the operative theater.  Once adequate anesthesia, preoperative antibiotics, 2gm of Ancef, 1 gm of Tranexamic Acid, and 10mg  of Decadron administered, the patient was positioned in supine position with a right thigh tourniquet  placed. The right lower extremity was  prepped and draped in sterile  fashion with the leg on the Oxford leg holder.  The leg was allowed to flex to 120 degrees. A time-out  was performed identifying the patient, planned procedure, and extremity.  The leg was exsanguinated, tourniquet elevated to 250 mmHg. A midline  incision was made from the proximal pole of the patella to the tibial tubercle. A  soft tissue plane was created and partial median arthrotomy was then  made to allow for subluxation of the patella. Following initial synovectomy and  debridement, the osteophytes were removed off the medial aspect of the  knee.   Attention was first directed to the tibia. The tibial  extramedullary guide was positioned over the anterior crest of the tibia  and pinned into position, and using a measured resection guide from the  Oxford system, a 4 mm resection was made off the proximal tibia. First  the reciprocating saw along the medial aspect of the tibial spines, then the oscillating saw.    At this point, I sized this cut surface seem to be best fit for a size AA tibial tray.  With the retractors out of the wound and the knee held at 90 degrees the 4 feeler gauge had appropriate tension on the medial ligament.   At this point, the femoral canal was opened with a drill and the  intramedullary rod passed. Then using the guide for a X-small posterior resection off  the posterior aspect of the femur was positioned over the mid portion of the medial femoral condyle.  The orientation was set using the guide that mates the femoral guide to the intramedullary rod.  The 2 drill holes were  made into the distal femur.  The posterior guide was then impacted into place and the posterior  femoral cut made.  At this point, I milled the distal femur with a size 4 spigot in place. At this point, we did a trial reduction of the X-small femur, size AA tibial tray and a 4- then 5 feeler gauge insert. At 90 degrees of  flexion and at 20 degrees of  flexion the knee had symmetric tension on  the ligaments.   Given these findings, the trial femoral component was removed. Final preparation of tibia was carried out by pinning it in position. Then  using a reciprocating saw I removed bone for the keel. Further bone was  removed with an osteotome.  Trial reduction was now carried out with the X-small femur, the right medial size AA keeled tibia, and the size 5 lollipop insert. The balance of the  ligaments appeared to be symmetric at 20 degrees and 90 degrees. Given  all these findings, the trial components were removed.   Cement was mixed. The final components were opened. The synovial capsular junction was injected with 30cc of 0.25% marcaine, 1cc of Toradol, and 30cc of NS.  The knee was irrigated with  normal saline solution. Then final debridements of the  soft tissue was carried out, I also drilled the sclerotic bone with a drill.  The final components were cemented with a single batch of cement in a  two-stage technique with the tibial component cemented first. The knee  was then brought  to 45 degrees of flexion with a 5 feeler gauge, held with pressure for a minute and half.  After this the femoral component was cemented in place.  The knee was again held at 45 degrees of flexion while the cement fully cured.  Excess cement was removed throughout the knee. Tourniquet was let down  after 29 minutes. After the cement had fully cured and excessive cement  was removed throughout the knee there was no visualized cement present.   The final right medial size 5 insert to match the X-small femure was chosen and snapped into position. We re-irrigated  the knee. The extensor mechanism  was then reapproximated using a #1 Vicryl with the knee in flexion. The  remaining wound was closed with 2-0 Vicryl and a running 4-0 Monocryl.  The knee was cleaned, dried, and dressed sterilely using Dermabond and  Aquacel dressing. The patient  was  brought to the recovery room, Ace wrap in place, tolerating the  procedure well. She will be in the hospital for overnight observation.  We will initiate physical therapy and progress to ambulate.     Madlyn Frankel Charlann Boxer, M.D.

## 2014-11-09 NOTE — Transfer of Care (Signed)
Immediate Anesthesia Transfer of Care Note  Patient: Melissa Lee  Procedure(s) Performed: Procedure(s): UNICOMPARTMENTAL KNEE, MEDIAL SIDE (Right)  Patient Location: PACU  Anesthesia Type:Regional  Level of Consciousness: awake, alert  and oriented  Airway & Oxygen Therapy: Patient Spontanous Breathing and Patient connected to face mask oxygen  Post-op Assessment: Report given to PACU RN and Post -op Vital signs reviewed and stable  Post vital signs: Reviewed and stable  Complications: No apparent anesthesia complications

## 2014-11-09 NOTE — Anesthesia Preprocedure Evaluation (Signed)
Anesthesia Evaluation   Patient awake    Reviewed: Allergy & Precautions, NPO status , Patient's Chart, lab work & pertinent test results  History of Anesthesia Complications Negative for: history of anesthetic complications  Airway        Dental   Pulmonary neg pulmonary ROS,          Cardiovascular negative cardio ROS      Neuro/Psych Anxiety negative neurological ROS     GI/Hepatic Neg liver ROS, GERD-  Medicated,  Endo/Other  negative endocrine ROS  Renal/GU negative Renal ROS     Musculoskeletal  (+) Arthritis -,   Abdominal   Peds  Hematology negative hematology ROS (+)   Anesthesia Other Findings   Reproductive/Obstetrics                             Anesthesia Physical Anesthesia Plan  ASA: II  Anesthesia Plan: Spinal   Post-op Pain Management:    Induction:   Airway Management Planned: Simple Face Mask  Additional Equipment:   Intra-op Plan:   Post-operative Plan:   Informed Consent:   Plan Discussed with:   Anesthesia Plan Comments:         Anesthesia Quick Evaluation

## 2014-11-09 NOTE — Anesthesia Postprocedure Evaluation (Signed)
Anesthesia Post Note  Patient: Melissa Lee S Kadow  Procedure(s) Performed: Procedure(s) (LRB): UNICOMPARTMENTAL KNEE, MEDIAL SIDE (Right)  Anesthesia type: MAC/SAB  Patient location: PACU  Post pain: Pain level controlled  Post assessment: Patient's Cardiovascular Status Stable  Last Vitals:  Filed Vitals:   11/09/14 1230  BP: 108/51  Pulse:   Temp: 36.3 C  Resp:     Post vital signs: Reviewed and stable  Level of consciousness: sedated  Complications: No apparent anesthesia complications

## 2014-11-09 NOTE — Progress Notes (Signed)
pacu note: Called 6th floor.  Advised that pt will be coming to room 1620 however, her spinal needs to wear off a little more. Info acknowledged.

## 2014-11-10 ENCOUNTER — Encounter (HOSPITAL_COMMUNITY): Payer: Self-pay | Admitting: Orthopedic Surgery

## 2014-11-10 DIAGNOSIS — M1711 Unilateral primary osteoarthritis, right knee: Secondary | ICD-10-CM | POA: Diagnosis not present

## 2014-11-10 LAB — BASIC METABOLIC PANEL
Anion gap: 4 — ABNORMAL LOW (ref 5–15)
BUN: 13 mg/dL (ref 6–23)
CO2: 26 mmol/L (ref 19–32)
CREATININE: 0.73 mg/dL (ref 0.50–1.10)
Calcium: 8.4 mg/dL (ref 8.4–10.5)
Chloride: 109 mEq/L (ref 96–112)
GFR calc Af Amer: >90 mL/min (ref >90)
GFR calc non Af Amer: >90 mL/min (ref >90)
Glucose, Bld: 117 mg/dL — ABNORMAL HIGH (ref 70–99)
POTASSIUM: 4.2 mmol/L (ref 3.5–5.1)
Sodium: 139 mmol/L (ref 135–145)

## 2014-11-10 LAB — CBC
HCT: 35 % — ABNORMAL LOW (ref 36.0–46.0)
Hemoglobin: 11.4 g/dL — ABNORMAL LOW (ref 12.0–15.0)
MCH: 29.8 pg (ref 26.0–34.0)
MCHC: 32.6 g/dL (ref 30.0–36.0)
MCV: 91.6 fL (ref 78.0–100.0)
Platelets: 283 10*3/uL (ref 150–400)
RBC: 3.82 MIL/uL — ABNORMAL LOW (ref 3.87–5.11)
RDW: 12.5 % (ref 11.5–15.5)
WBC: 17.7 10*3/uL — ABNORMAL HIGH (ref 4.0–10.5)

## 2014-11-10 MED ORDER — POLYETHYLENE GLYCOL 3350 17 G PO PACK: 17.0000 g | PACK | Freq: Two times a day (BID) | ORAL | Status: DC

## 2014-11-10 MED ORDER — HYDROCODONE-ACETAMINOPHEN 7.5-325 MG PO TABS: 1.0000 | ORAL_TABLET | ORAL | Status: DC | PRN

## 2014-11-10 MED ORDER — FERROUS SULFATE 325 (65 FE) MG PO TABS: 325.0000 mg | ORAL_TABLET | Freq: Three times a day (TID) | ORAL | Status: AC

## 2014-11-10 MED ORDER — DSS 100 MG PO CAPS: 100.0000 mg | ORAL_CAPSULE | Freq: Two times a day (BID) | ORAL | Status: DC

## 2014-11-10 MED ORDER — CIPROFLOXACIN HCL 500 MG PO TABS: 500.0000 mg | ORAL_TABLET | Freq: Two times a day (BID) | ORAL | Status: AC

## 2014-11-10 MED ORDER — ASPIRIN 325 MG PO TBEC
325.0000 mg | DELAYED_RELEASE_TABLET | Freq: Two times a day (BID) | ORAL | Status: AC
Start: 2014-11-10 — End: 2014-12-08

## 2014-11-10 MED ORDER — CIPROFLOXACIN HCL 500 MG PO TABS
500.0000 mg | ORAL_TABLET | Freq: Two times a day (BID) | ORAL | Status: DC
  Filled 2014-11-10 (×3): qty 1

## 2014-11-10 MED ORDER — METHOCARBAMOL 500 MG PO TABS: 500.0000 mg | ORAL_TABLET | Freq: Four times a day (QID) | ORAL | Status: DC | PRN

## 2014-11-10 NOTE — Care Management Note (Signed)
    Page 1 of 2   11/10/2014     10:04:21 AM CARE MANAGEMENT NOTE 11/10/2014  Patient:  ARLENA, MARSAN   Account Number:  [id number]  Date Initiated:  11/10/2014  Documentation initiated by:  [facility]  Subjective/Objective Assessment:   adm: UNICOMPARTMENTAL KNEE, MEDIAL SIDE (Right)     Action/Plan:   discharge planning   Anticipated DC Date:  11/10/2014   Anticipated DC Plan:  [place]  CM consult      [facility] Choice  HOME HEALTH   Choice offered to / List presented to:  C-1 Patient   DME arranged  NA      DME agency  NA     [place] arranged  HH-2 PT      [place]   Status of service:  Completed, signed off Medicare Important Message given?   (If response is "NO", the following Medicare IM given date fields will be blank) Date Medicare IM given:   Medicare IM given by:   Date Additional Medicare IM given:   Additional Medicare IM given by:    Discharge Disposition:  [place]  Per UR Regulation:    If discussed at Long Length of Stay Meetings, dates discussed:    Comments:  11/10/14 09:55 CM met with pt in room to offer choice of home health agency.  Pt chooses [facility] to render HHPT.  Pt has rolling walker and does not need 3n1.  Address and contact information verified with pt.  Referral given to [employer], Tim.  No other CM needs were communicated. Tempie Hoist, [facility], [place].

## 2014-11-10 NOTE — Progress Notes (Signed)
Physical Therapy Treatment Patient Details Name: Melissa Lee MRN: [id number] DOB: April 06, 1951 Today's Date: 11/10/2014    History of Present Illness s/p R UKR    PT Comments    Pt making excellent progress, feels ready for D/C  Follow Up Recommendations  Home health PT     Equipment Recommendations  None recommended by PT    Recommendations for Other Services       Precautions / Restrictions Precautions Precautions: Fall Restrictions Weight Bearing Restrictions: No Other Position/Activity Restrictions: WBAT    Mobility  Bed Mobility Overal bed mobility: Modified Independent Bed Mobility: Supine to Sit     Supine to sit: Modified independent (Device/Increase time)        Transfers Overall transfer level: Modified independent Equipment used: Straight cane Transfers: Sit to/from Stand Sit to Stand: Modified independent (Device/Increase time) Stand pivot transfers: Modified independent (Device/Increase time)          Ambulation/Gait Ambulation/Gait assistance: Supervision Ambulation Distance (Feet): 250 Feet Assistive device: Straight cane Gait Pattern/deviations: Step-to pattern;Step-through pattern;Decreased step length - right;Decreased step length - left;Decreased weight shift to right     General Gait Details: cues for sequence with cane   Stairs Stairs: Yes Stairs assistance: Min guard Stair Management: One rail Left;With cane;Step to pattern;Forwards Number of Stairs: 5 General stair comments: cues for sequence  Wheelchair Mobility    Modified Rankin (Stroke Patients Only)       Balance Overall balance assessment: No apparent balance deficits (not formally assessed)                                  Cognition Arousal/Alertness: Awake/alert Behavior During Therapy: WFL for tasks assessed/performed Overall Cognitive Status: Within Functional Limits for tasks assessed                      Exercises Total Joint  Exercises Ankle Circles/Pumps: AROM;Both;10 reps Quad Sets: 10 reps;Both;AROM Short Arc Quad: AROM;Strengthening;Right;10 reps Heel Slides: AROM;AAROM;Right;10 reps Hip ABduction/ADduction: AROM;Strengthening;Right;10 reps Straight Leg Raises: AAROM;Strengthening;Right;10 reps    General Comments General comments (skin integrity, edema, etc.): Pt does not need OT services.      Pertinent Vitals/Pain Pain Assessment: 0-10 Pain Score: 4  Pain Location: R lnee Pain Descriptors / Indicators: Sore Pain Intervention(s): Limited activity within patient's tolerance;Monitored during session;Premedicated before session;Ice applied    Home Living Family/patient expects to be discharged to:: Private residence Living Arrangements: Spouse/significant other Available Help at Discharge: Available 24 hours/day Type of Home: House Home Access: Stairs to enter Entrance Stairs-Rails: Right Home Layout: One level Home Equipment: Cane - single point;Walker - standard      Prior Function Level of Independence: Independent      Comments: cleans housees   PT Goals (current goals can now be found in the care plan section) Acute Rehab PT Goals Patient Stated Goal: home, knee better and less pain PT Goal Formulation: With patient Time For Goal Achievement: 11/12/14 Potential to Achieve Goals: Good Progress towards PT goals: Progressing toward goals    Frequency  7X/week    PT Plan Current plan remains appropriate    Co-evaluation             End of Session Equipment Utilized During Treatment: Gait belt Activity Tolerance: Patient tolerated treatment well Patient left: in chair;with call bell/phone within reach;with family/visitor present     Time: 8413-24400916-0941 PT Time Calculation (min) (ACUTE ONLY): 25  min  Charges:  $Gait Training: 8-22 mins $Therapeutic Exercise: 8-22 mins                    G Codes:      Divon Krabill 2014-11-16, 9:45 AM

## 2014-11-10 NOTE — Evaluation (Signed)
Occupational Therapy Evaluation and Discharge Summary Patient Details Name: Melissa Lee MRN: [id number] DOB: 1951/08/24 Today's Date: 11/10/2014    History of Present Illness s/p R UKR   Clinical Impression   Pt admitted for above diagnosis and overall is doing very well.  Pt is able to complete all basic adls with walker and extra time. Pt will have husband at home to assist as needed.  Will d/c OT.    Follow Up Recommendations  No OT follow up    Equipment Recommendations  None recommended by OT    Recommendations for Other Services       Precautions / Restrictions Precautions Precautions: Fall Restrictions Weight Bearing Restrictions: No Other Position/Activity Restrictions: WBAT      Mobility Bed Mobility Overal bed mobility: Modified Independent Bed Mobility: Supine to Sit     Supine to sit: Modified independent (Device/Increase time)        Transfers Overall transfer level: Modified independent Equipment used: Rolling walker (2 wheeled) Transfers: Sit to/from [employer] Sit to Stand: Modified independent (Device/Increase time) Stand pivot transfers: Modified independent (Device/Increase time)            Balance Overall balance assessment: No apparent balance deficits (not formally assessed)                                          ADL Overall ADL's : Modified independent                                       General ADL Comments: Overall pt did great with adls.  Pt able to walk to bathroom and toilet, groom and donn socks all just given extra time.     Vision                     Perception     Praxis      Pertinent Vitals/Pain Pain Assessment: 0-10 Pain Score: 5  Pain Location: knee Pain Descriptors / Indicators: Aching Pain Intervention(s): Monitored during session;Repositioned;Premedicated before session     Hand Dominance Right   Extremity/Trunk Assessment Upper  Extremity Assessment Upper Extremity Assessment: Overall WFL for tasks assessed   Lower Extremity Assessment Lower Extremity Assessment: Defer to PT evaluation   Cervical / Trunk Assessment Cervical / Trunk Assessment: Normal   Communication Communication Communication: No difficulties   Cognition Arousal/Alertness: Awake/alert Behavior During Therapy: WFL for tasks assessed/performed Overall Cognitive Status: Within Functional Limits for tasks assessed                     General Comments       Exercises       Shoulder Instructions      Home Living Family/patient expects to be discharged to:: Private residence Living Arrangements: Spouse/significant other Available Help at Discharge: Available 24 hours/day Type of Home: House Home Access: Stairs to enter [employer] of Steps: 5 Entrance Stairs-Rails: Right Home Layout: One level     Bathroom Shower/Tub: Tub/shower unit;Curtain Shower/tub characteristics: Engineer, building services: Standard Bathroom Accessibility: Yes How Accessible: Accessible via walker Home Equipment: Cane - single point;Walker - standard          Prior Functioning/Environment Level of Independence: Independent        Comments: cleans housees  OT Diagnosis:     OT Problem List:     OT Treatment/Interventions:      OT Goals(Current goals can be found in the care plan section) Acute Rehab OT Goals Patient Stated Goal: home, knee better and less pain OT Goal Formulation: All assessment and education complete, DC therapy  OT Frequency:     Barriers to D/C:            Co-evaluation              End of Session Equipment Utilized During Treatment: Rolling walker Nurse Communication: Mobility status  Activity Tolerance: Patient tolerated treatment well Patient left: in chair;with call bell/phone within reach   Time: 0854-0910 OT Time Calculation (min): 16 min Charges:  OT General Charges $OT Visit: 1  Procedure OT Evaluation $Initial OT Evaluation Tier I: 1 Procedure OT Treatments $Self Care/Home Management : 8-22 mins G-Codes: OT G-codes **NOT FOR INPATIENT CLASS** Functional Assessment Tool Used: clinical judgement Functional Limitation: Self care Self Care Current Status ([id number]([id number]): At least 1 percent but less than 20 percent impaired, limited or restricted Self Care Goal Status (U0454(G8988): At least 1 percent but less than 20 percent impaired, limited or restricted Self Care Discharge Status [phone]([id number]): At least 1 percent but less than 20 percent impaired, limited or restricted  Hope BuddsJones, Aqueelah Cotrell Anne 11/10/2014, 9:16 AM  [phone]

## 2014-11-10 NOTE — Progress Notes (Signed)
RN reviewed discharge instructions with patient and family. All questions answered.  Paperwork and prescriptions given.   NT rolled patient down in wheelchair to family car.  

## 2014-11-10 NOTE — Progress Notes (Addendum)
Patient ID: Melissa Lee, female   DOB: 07-04-1951, 64 y.o.   MRN: [id number] Subjective: 1 Day Post-Op Procedure(s) (LRB): UNICOMPARTMENTAL KNEE, MEDIAL SIDE (Right)    Patient reports pain as mild.  Some burning with urination after catheter removal  Objective:   VITALS:   Filed Vitals:   11/10/14 0339  BP:   Pulse:   Temp:   Resp: 15    Neurovascular intact Incision: dressing C/D/I  LABS  Recent Labs  11/10/14 0500  HGB 11.4*  HCT 35.0*  WBC 17.7*  PLT 283     Recent Labs  11/10/14 0500  NA 139  K 4.2  BUN 13  CREATININE 0.73  GLUCOSE 117*    No results for input(s): LABPT, INR in the last 72 hours.   Assessment/Plan: 1 Day Post-Op Procedure(s) (LRB): UNICOMPARTMENTAL KNEE, MEDIAL SIDE (Right)   Advance diet Up with therapy Discharge home with home health today after am therapy Will discharge on CIPRO for 3 days due to UTI symptoms that she is familiar with, no testing required

## 2014-11-16 NOTE — Discharge Summary (Signed)
Physician Discharge Summary  Patient ID: Melissa Lee S Wellen MRN: [id number] DOB/AGE: February 06, 1951 64 y.o.  Admit date: 11/09/2014 Discharge date: 11/10/2014   Procedures:  Procedure(s) (LRB): UNICOMPARTMENTAL KNEE, MEDIAL SIDE (Right)  Attending Physician:  Dr. Durene RomansMatthew Olin   Admission Diagnoses:   Right knee medial compartment primary OA / pain  Discharge Diagnoses:  Principal Problem:   S/P right UKR Active Problems:   Status post unilateral knee replacement  Past Medical History  Diagnosis Date  . Anxiety   . GERD (gastroesophageal reflux disease)     occas   . Tingling in extremities     left arm comes and goes pt due to have ECHO 11/03/2014  . Arthritis     HPI: Melissa Lee S Soulliere, 64 y.o. female, has a history of pain and functional disability in the right knee due to arthritis and has failed non-surgical conservative treatments for greater than 12 weeks to include NSAID's and/or analgesics, corticosteriod injections, viscosupplementation injections, use of assistive devices and activity modification. Onset of symptoms was gradual, starting 1+ years ago with gradually worsening course since that time. Patient currently rates pain in the right knee(s) at 8 out of 10 with activity. Patient has worsening of pain with activity and weight bearing, pain that interferes with activities of daily living, pain with passive range of motion, crepitus and joint swelling. Patient has evidence of periarticular osteophytes and joint space narrowing of the medial compartment by imaging studies. There is no active infection. Risks, benefits and expectations were discussed with the patient. Risks including but not limited to the risk of anesthesia, blood clots, nerve damage, blood vessel damage, failure of the prosthesis, infection and up to and including death. Patient understand the risks, benefits and expectations and wishes to proceed with surgery.   PCP: Galvin ProfferHAGUE, IMRAN P, MD   Discharged  Condition: good  Hospital Course:  Patient underwent the above stated procedure on 11/09/2014. Patient tolerated the procedure well and brought to the recovery room in good condition and subsequently to the floor.  POD #1 BP: 122/70 ; Pulse: 92 ; Temp: 98.4 F (36.9 C) ; Resp: 16 Patient reports pain as mild. Some burning with urination after catheter removal. Ready to be discharged home. Dorsiflexion/plantar flexion intact, incision: dressing C/D/I, no cellulitis present and compartment soft.   LABS  Basename    HGB  11.4  HCT  35.0    Discharge Exam: General appearance: alert, cooperative and no distress Extremities: Homans sign is negative, no sign of DVT, no edema, redness or tenderness in the calves or thighs and no ulcers, gangrene or trophic changes  Disposition: Home with follow up in 2 weeks   Follow-up Information    [facility]   Why:  home health physical therapy   Contact information:   [address] [place] [id number] [phone]       Follow up with Shelda PalLIN,Salsabeel Gorelick D, MD. Schedule an appointment as soon as possible for a visit in 2 weeks.   Specialty:  Orthopedic Surgery   Contact information:   [address] [place] [id number] [phone]       Discharge Instructions    Call MD / Call 911    Complete by:  As directed   If you experience chest pain or shortness of breath, CALL 911 and be transported to the hospital emergency room.  If you develope a fever above 101 F, pus (white drainage) or increased drainage or  redness at the wound, or calf pain, call your surgeon's office.     Change dressing    Complete by:  As directed   Maintain surgical dressing until follow up in the clinic. If the edges start to pull up, may reinforce with tape. If the dressing is no longer working, may remove and cover with gauze and tape, but must keep the area dry and clean.  Call with any questions or concerns.      Constipation Prevention    Complete by:  As directed   Drink plenty of fluids.  Prune juice may be helpful.  You may use a stool softener, such as Colace (over the counter) 100 mg twice a day.  Use MiraLax (over the counter) for constipation as needed.     Diet - low sodium heart healthy    Complete by:  As directed      Discharge instructions    Complete by:  As directed   Maintain surgical dressing until follow up in the clinic. If the edges start to pull up, may reinforce with tape. If the dressing is no longer working, may remove and cover with gauze and tape, but must keep the area dry and clean.  Follow up in 2 weeks at [facility]. Call with any questions or concerns.     Driving restrictions    Complete by:  As directed   No driving for 4 weeks     Increase activity slowly as tolerated    Complete by:  As directed      TED hose    Complete by:  As directed   Use stockings (TED hose) for 2 weeks on both leg(s).  You may remove them at night for sleeping.     Weight bearing as tolerated    Complete by:  As directed   Laterality:  right  Extremity:  Lower             Medication List    TAKE these medications        aspirin 325 MG EC tablet  Take 1 tablet (325 mg total) by mouth 2 (two) times daily.     ciprofloxacin 500 MG tablet  Commonly known as:  CIPRO  Take 1 tablet (500 mg total) by mouth 2 (two) times daily.     DSS 100 MG Caps  Take 100 mg by mouth 2 (two) times daily.     estradiol 1 MG tablet  Commonly known as:  ESTRACE  Take 1 mg by mouth daily.     ferrous sulfate 325 (65 FE) MG tablet  Take 1 tablet (325 mg total) by mouth 3 (three) times daily after meals.     HYDROcodone-acetaminophen 7.5-325 MG per tablet  Commonly known as:  NORCO  Take 1-2 tablets by mouth every 4 (four) hours as needed for moderate pain.     LORazepam 1 MG tablet  Commonly known as:  ATIVAN  Take 1 mg by mouth at bedtime.     methocarbamol 500 MG tablet    Commonly known as:  ROBAXIN  Take 1 tablet (500 mg total) by mouth every 6 (six) hours as needed for muscle spasms.     oxymetazoline 0.05 % nasal spray  Commonly known as:  AFRIN  Place 1 spray into both nostrils at bedtime as needed for congestion.     polyethylene glycol packet  Commonly known as:  MIRALAX / GLYCOLAX  Take 17 g by mouth 2 (two) times daily.  simvastatin 40 MG tablet  Commonly known as:  ZOCOR  Take 40 mg by mouth daily.         Signed: Anastasio Auerbach. Shadiamond Koska   PA-C  11/16/2014, 12:31 PM

## 2016-05-21 IMAGING — CR DG CHEST 2V
2 series · 2 of 2 positions shown · non-contrast
Comparison: None.

CLINICAL DATA: Tingling in extremities.

EXAM:
CHEST  2 VIEW

[w chest pa]
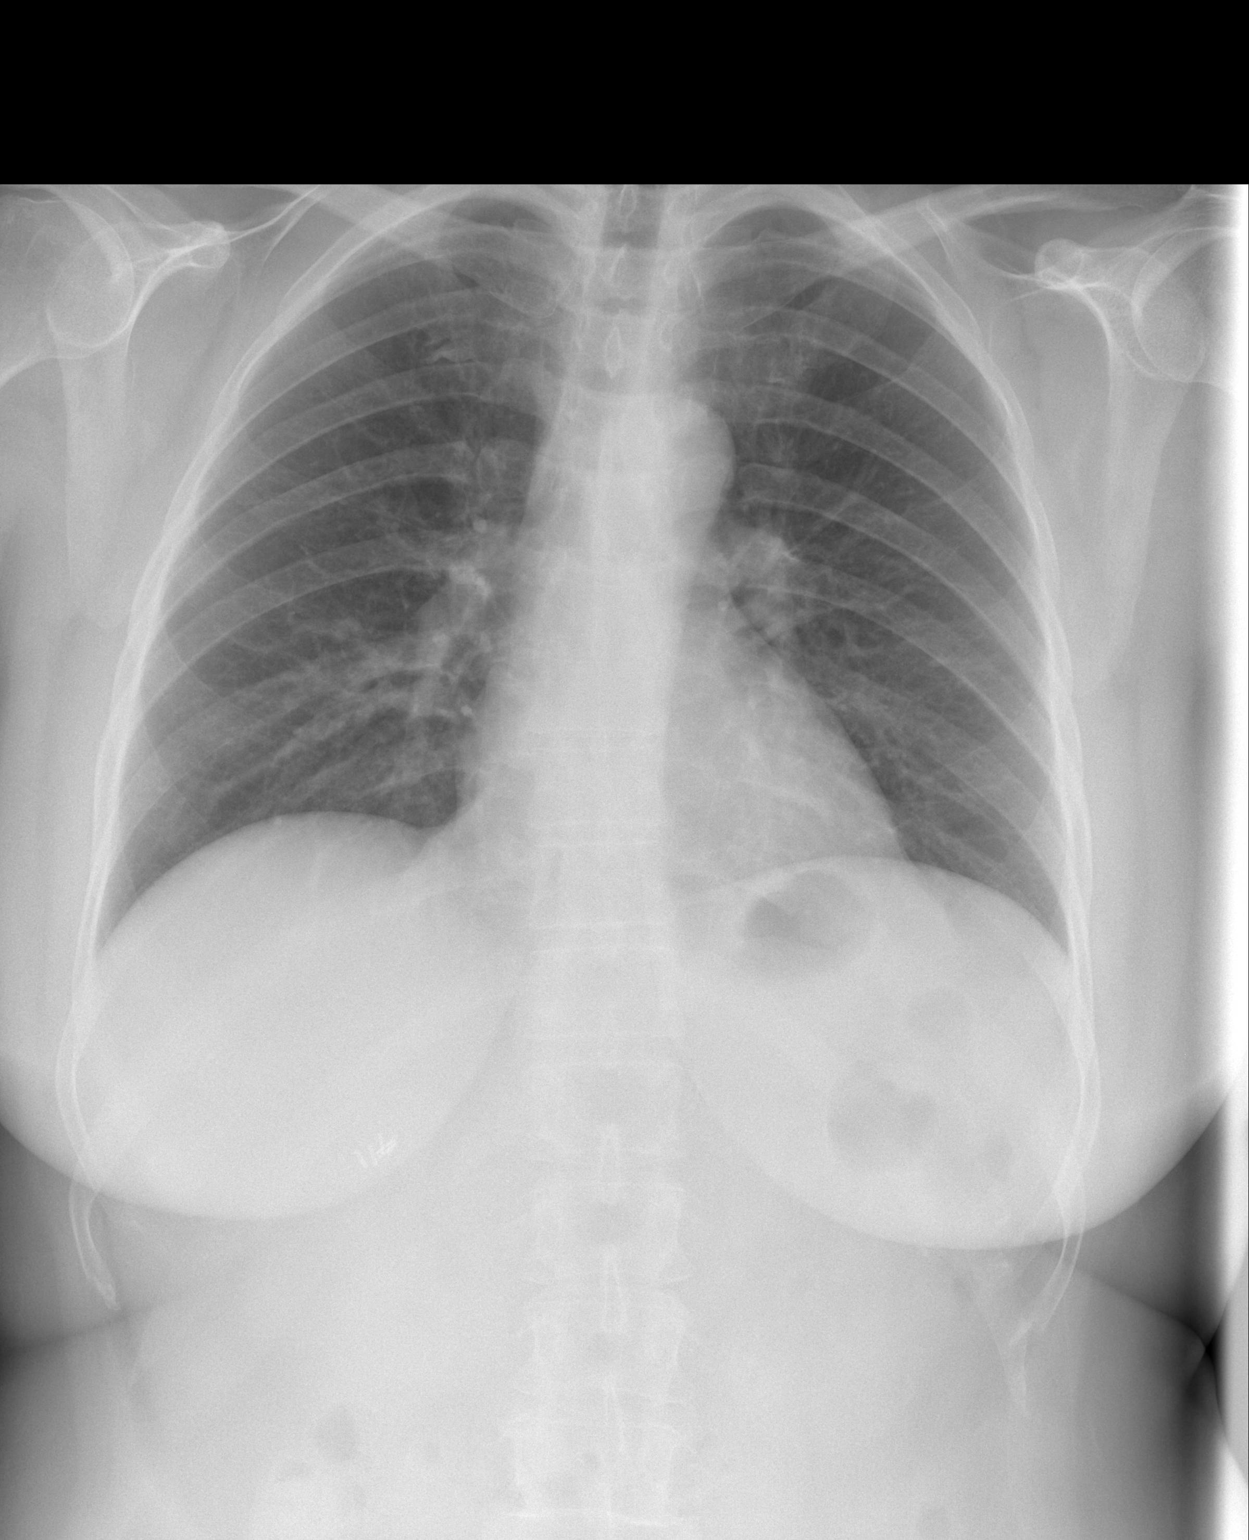

[w chest lat]
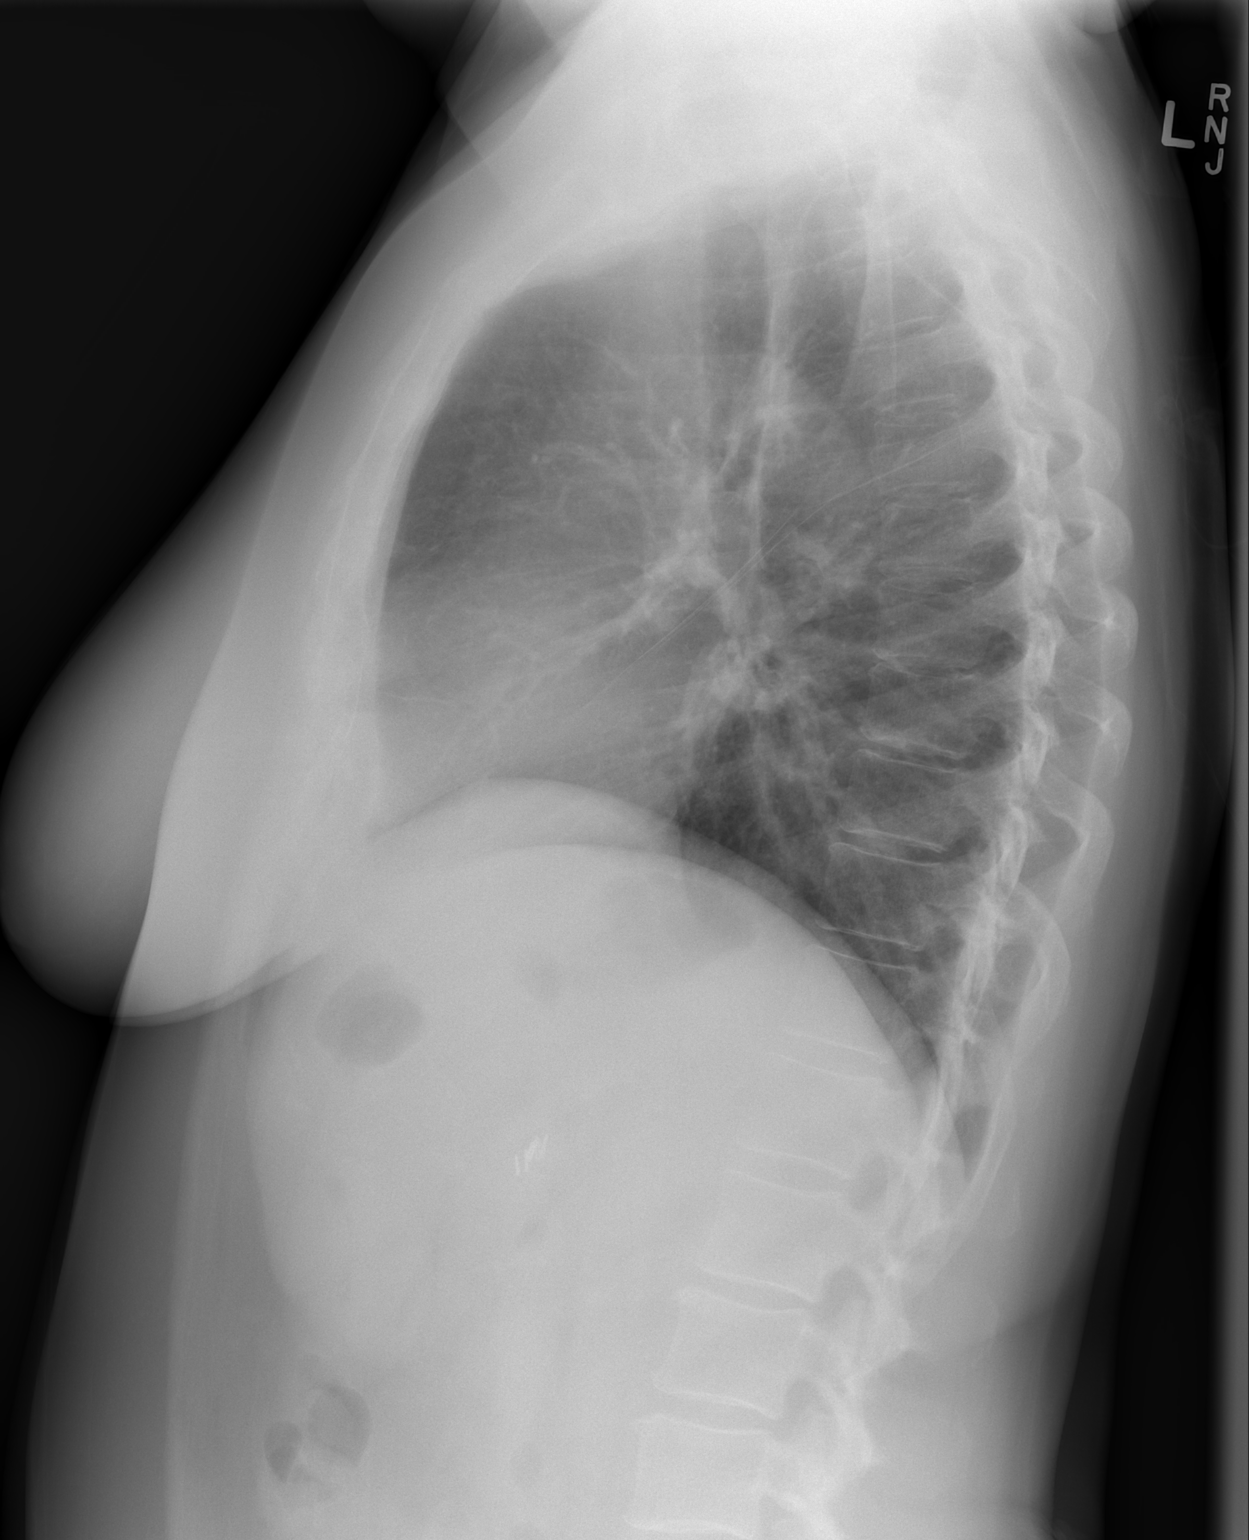

[2 of 2 positions shown; findings below may reference images not displayed]

FINDINGS: The heart size and mediastinal contours are within normal limits.
Both lungs are clear. The visualized skeletal structures are
unremarkable.
IMPRESSION: No active cardiopulmonary disease.

## 2019-01-09 ENCOUNTER — Other Ambulatory Visit: Payer: Self-pay | Admitting: Internal Medicine

## 2019-01-09 DIAGNOSIS — R5381 Other malaise: Secondary | ICD-10-CM

## 2019-01-17 ENCOUNTER — Other Ambulatory Visit: Payer: Self-pay | Admitting: Internal Medicine

## 2019-01-17 DIAGNOSIS — Z1231 Encounter for screening mammogram for malignant neoplasm of breast: Secondary | ICD-10-CM

## 2019-01-17 DIAGNOSIS — M8589 Other specified disorders of bone density and structure, multiple sites: Secondary | ICD-10-CM

## 2021-04-04 ENCOUNTER — Other Ambulatory Visit: Payer: Self-pay | Admitting: Internal Medicine

## 2021-04-04 DIAGNOSIS — Z1231 Encounter for screening mammogram for malignant neoplasm of breast: Secondary | ICD-10-CM

## 2021-11-22 ENCOUNTER — Ambulatory Visit (AMBULATORY_SURGERY_CENTER): Payer: Medicare HMO | Admitting: *Deleted

## 2021-11-22 ENCOUNTER — Other Ambulatory Visit: Payer: Self-pay

## 2021-11-22 VITALS — Ht 61.0 in | Wt 161.0 lb

## 2021-11-22 DIAGNOSIS — Z1211 Encounter for screening for malignant neoplasm of colon: Secondary | ICD-10-CM

## 2021-11-22 NOTE — Progress Notes (Signed)
No egg or soy allergy known to patient  No issues known to pt with past sedation with any surgeries or procedures Patient denies ever being told they had issues or difficulty with intubation  No FH of Malignant Hyperthermia Pt is not on diet pills Pt is not on  home 02  Pt is not on blood thinners  Pt denies issues with constipation - pt is having diarrhea issues currently  No A fib or A flutter  Pt is fully vaccinated  for Covid    Due to the COVID-19 pandemic we are asking patients to follow certain guidelines in PV and the [facility]   Pt aware of COVID protocols and [facility] guidelines   PV completed over the phone. Pt verified name, DOB, address and insurance during PV today.  Pt e mailed instruction packet  per pt's request  Pt encouraged to call with questions or issues.

## 2021-12-06 ENCOUNTER — Encounter: Payer: Self-pay | Admitting: Gastroenterology

## 2022-01-03 ENCOUNTER — Encounter: Payer: Self-pay | Admitting: Gastroenterology

## 2023-01-04 ENCOUNTER — Other Ambulatory Visit: Payer: Self-pay | Admitting: Internal Medicine

## 2023-01-04 DIAGNOSIS — Z1231 Encounter for screening mammogram for malignant neoplasm of breast: Secondary | ICD-10-CM
